# Patient Record
Sex: Male | Born: 1965
Health system: Southern US, Community
[De-identification: ages and names within clinical notes are randomized; demographics above are authoritative.]

---

## 1997-10-21 ENCOUNTER — Emergency Department (HOSPITAL_COMMUNITY): Admission: EM | Admit: 1997-10-21 | Discharge: 1997-10-21 | Payer: Self-pay | Admitting: Family Medicine

## 1998-05-07 ENCOUNTER — Ambulatory Visit (HOSPITAL_COMMUNITY): Admission: RE | Admit: 1998-05-07 | Discharge: 1998-05-07 | Payer: Self-pay | Admitting: Gastroenterology

## 2000-12-20 ENCOUNTER — Encounter: Admission: RE | Admit: 2000-12-20 | Discharge: 2000-12-20 | Payer: Self-pay | Admitting: Gastroenterology

## 2000-12-20 ENCOUNTER — Encounter: Payer: Self-pay | Admitting: Gastroenterology

## 2009-12-05 ENCOUNTER — Emergency Department (HOSPITAL_COMMUNITY): Admission: EM | Admit: 2009-12-05 | Discharge: 2009-12-05 | Payer: Self-pay | Admitting: Emergency Medicine

## 2010-05-26 LAB — D-DIMER, QUANTITATIVE: D-Dimer, Quant: <0.22 ug/mL-FEU (ref 0.00–0.48)

## 2010-05-26 LAB — BASIC METABOLIC PANEL
BUN: 17 mg/dL (ref 6–23)
CO2: 27 mEq/L (ref 19–32)
Calcium: 9.2 mg/dL (ref 8.4–10.5)
Chloride: 106 mEq/L (ref 96–112)
Creatinine, Ser: 0.96 mg/dL (ref 0.4–1.5)
GFR calc Af Amer: >60 mL/min (ref >60)
GFR calc non Af Amer: >60 mL/min (ref >60)
Glucose, Bld: 115 mg/dL — ABNORMAL HIGH (ref 70–99)
Potassium: 4.2 mEq/L (ref 3.5–5.1)
Sodium: 138 mEq/L (ref 135–145)

## 2010-05-26 LAB — DIFFERENTIAL
Basophils Absolute: 0 10*3/uL (ref 0.0–0.1)
Basophils Relative: 1 % (ref 0–1)
Eosinophils Absolute: 0.1 10*3/uL (ref 0.0–0.7)
Eosinophils Relative: 2 % (ref 0–5)
Lymphocytes Relative: 32 % (ref 12–46)
Lymphs Abs: 1.6 10*3/uL (ref 0.7–4.0)
Monocytes Absolute: 0.5 10*3/uL (ref 0.1–1.0)
Monocytes Relative: 11 % (ref 3–12)
Neutro Abs: 2.8 10*3/uL (ref 1.7–7.7)
Neutrophils Relative %: 55 % (ref 43–77)

## 2010-05-26 LAB — CBC
HCT: 42.6 % (ref 39.0–52.0)
Hemoglobin: 14.8 g/dL (ref 13.0–17.0)
MCH: 29.8 pg (ref 26.0–34.0)
MCHC: 34.7 g/dL (ref 30.0–36.0)
MCV: 85.9 fL (ref 78.0–100.0)
Platelets: 266 10*3/uL (ref 150–400)
RBC: 4.96 MIL/uL (ref 4.22–5.81)
RDW: 13 % (ref 11.5–15.5)
WBC: 5 10*3/uL (ref 4.0–10.5)

## 2017-11-03 DIAGNOSIS — A09 Infectious gastroenteritis and colitis, unspecified: Secondary | ICD-10-CM | POA: Diagnosis not present

## 2018-04-15 DIAGNOSIS — S92511A Displaced fracture of proximal phalanx of right lesser toe(s), initial encounter for closed fracture: Secondary | ICD-10-CM | POA: Diagnosis not present

## 2018-04-15 DIAGNOSIS — M79671 Pain in right foot: Secondary | ICD-10-CM | POA: Diagnosis not present

## 2018-05-06 DIAGNOSIS — S92511A Displaced fracture of proximal phalanx of right lesser toe(s), initial encounter for closed fracture: Secondary | ICD-10-CM | POA: Diagnosis not present

## 2019-01-27 DIAGNOSIS — M25521 Pain in right elbow: Secondary | ICD-10-CM | POA: Diagnosis not present

## 2019-02-20 DIAGNOSIS — M25521 Pain in right elbow: Secondary | ICD-10-CM | POA: Diagnosis not present

## 2019-03-18 DIAGNOSIS — Z125 Encounter for screening for malignant neoplasm of prostate: Secondary | ICD-10-CM | POA: Diagnosis not present

## 2019-03-18 DIAGNOSIS — Z13220 Encounter for screening for lipoid disorders: Secondary | ICD-10-CM | POA: Diagnosis not present

## 2019-03-18 DIAGNOSIS — Z0000 Encounter for general adult medical examination without abnormal findings: Secondary | ICD-10-CM | POA: Diagnosis not present

## 2019-05-05 ENCOUNTER — Ambulatory Visit: Payer: BC Managed Care – PPO | Admitting: Cardiovascular Disease

## 2019-05-05 ENCOUNTER — Other Ambulatory Visit: Payer: Self-pay

## 2019-05-05 ENCOUNTER — Encounter: Payer: Self-pay | Admitting: Cardiovascular Disease

## 2019-05-05 VITALS — BP 132/78 | HR 64 | Temp 96.8°F | Ht 68.0 in | Wt 177.2 lb

## 2019-05-05 DIAGNOSIS — E782 Mixed hyperlipidemia: Secondary | ICD-10-CM

## 2019-05-05 DIAGNOSIS — R079 Chest pain, unspecified: Secondary | ICD-10-CM

## 2019-05-05 MED ORDER — METOPROLOL TARTRATE 50 MG PO TABS: ORAL_TABLET | ORAL | 0 refills | Status: DC

## 2019-05-05 NOTE — Patient Instructions (Addendum)
Medication Instructions:  Take Metoprolol 2 hours before CT when scheduled.  *If you need a refill on your cardiac medications before your next appointment, please call your pharmacy*  Lab Work: BMET one week before CT when scheduled.  If you have labs (blood work) drawn today and your tests are completely normal, you will receive your results only by: Marland Kitchen MyChart Message (if you have MyChart) OR . A paper copy in the mail If you have any lab test that is abnormal or we need to change your treatment, we will call you to review the results.  Testing/Procedures: Your physician has requested that you have cardiac CT. Cardiac computed tomography (CT) is a painless test that uses an x-ray machine to take clear, detailed pictures of your heart. For further information please visit [email or web address]. Please follow instruction sheet as given.  Echocardiogram - Your physician has requested that you have an echocardiogram. Echocardiography is a painless test that uses sound waves to create images of your heart. It provides your doctor with information about the size and shape of your heart and how well your heart's chambers and valves are working. This procedure takes approximately one hour. There are no restrictions for this procedure. This will be performed at our [facility] location - [address], [address].    Follow-Up: At [facility], you and your health needs are our priority.  As part of our continuing mission to provide you with exceptional heart care, we have created designated Provider Care Teams.  These Care Teams include your primary Cardiologist (physician) and Advanced Practice Providers (APPs -  Physician Assistants and Nurse Practitioners) who all work together to provide you with the care you need, when you need it.  Your next appointment:   3 month(s)  The format for your next appointment:   In Person  Provider:   Eleonore Chiquito, MD  Other Instructions Your cardiac CT  will be scheduled at one of the below locations:   [facility] [address] [place], [place] [address] [phone]  If scheduled at [facility], please arrive at the [facility] main entrance of [facility] 30 minutes prior to test start time. Proceed to the [facility] Radiology Department (first floor) to check-in and test prep.  Please follow these instructions carefully (unless otherwise directed):  Hold all erectile dysfunction medications at least 3 days (72 hrs) prior to test.  On the Night Before the Test: . Be sure to Drink plenty of water. . Do not consume any caffeinated/decaffeinated beverages or chocolate 12 hours prior to your test. . Do not take any antihistamines 12 hours prior to your test. . If you take Metformin do not take 24 hours prior to test.  On the Day of the Test: . Drink plenty of water. Do not drink any water within one hour of the test. . Do not eat any food 4 hours prior to the test. . You may take your regular medications prior to the test.  . Take metoprolol (Lopressor) two hours prior to test. . HOLD Furosemide/Hydrochlorothiazide morning of the test. . FEMALES- please wear underwire-free bra if available       After the Test: . Drink plenty of water. . After receiving IV contrast, you may experience a mild flushed feeling. This is normal. . On occasion, you may experience a mild rash up to 24 hours after the test. This is not dangerous. If this occurs, you can take Benadryl 25  mg and increase your fluid intake. . If you experience trouble breathing, this can be serious. If it is severe call 911 IMMEDIATELY. If it is mild, please call our office. . If you take any of these medications: Glipizide/Metformin, Avandament, Glucavance, please do not take 48 hours after completing test unless otherwise instructed.   Once we have confirmed authorization from your insurance company, we will call you to set up a date and  time for your test.   For non-scheduling related questions, please contact the cardiac imaging nurse navigator should you have any questions/concerns: Rockwell Alexandria, RN Navigator Cardiac Imaging [facility] [phone] mobile
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
## 2019-05-19 ENCOUNTER — Ambulatory Visit (HOSPITAL_COMMUNITY): Payer: BC Managed Care – PPO | Attending: Cardiology

## 2019-05-19 ENCOUNTER — Other Ambulatory Visit: Payer: Self-pay

## 2019-05-19 DIAGNOSIS — R079 Chest pain, unspecified: Secondary | ICD-10-CM | POA: Diagnosis not present

## 2019-05-30 DIAGNOSIS — R079 Chest pain, unspecified: Secondary | ICD-10-CM | POA: Diagnosis not present

## 2019-05-31 LAB — BASIC METABOLIC PANEL
BUN/Creatinine Ratio: 19 (ref 9–20)
BUN: 18 mg/dL (ref 6–24)
CO2: 25 mmol/L (ref 20–29)
Calcium: 9 mg/dL (ref 8.7–10.2)
Chloride: 104 mmol/L (ref 96–106)
Creatinine, Ser: 0.96 mg/dL (ref 0.76–1.27)
GFR calc Af Amer: 104 mL/min/{1.73_m2} (ref >59)
GFR calc non Af Amer: 90 mL/min/{1.73_m2} (ref >59)
Glucose: 81 mg/dL (ref 65–99)
Potassium: 4.5 mmol/L (ref 3.5–5.2)
Sodium: 143 mmol/L (ref 134–144)

## 2019-06-05 ENCOUNTER — Ambulatory Visit (HOSPITAL_COMMUNITY)
Admission: RE | Admit: 2019-06-05 | Discharge: 2019-06-05 | Disposition: A | Discharge status: Home / Self Care | Payer: BC Managed Care – PPO | Source: Ambulatory Visit | Attending: Cardiovascular Disease | Admitting: Cardiovascular Disease

## 2019-06-05 ENCOUNTER — Other Ambulatory Visit: Payer: Self-pay

## 2019-06-05 ENCOUNTER — Encounter (HOSPITAL_COMMUNITY): Payer: Self-pay

## 2019-06-05 DIAGNOSIS — R079 Chest pain, unspecified: Secondary | ICD-10-CM | POA: Diagnosis not present

## 2019-06-05 IMAGING — CT CT HEART MORP W/ CTA COR W/ SCORE W/ CA W/CM &/OR W/O CM
1 of 2 series · 8 of 20 positions shown, 10 images · non-contrast
Comparison: None.
COMPARISON: None.

Addendum:
EXAM:
OVER-READ INTERPRETATION  CT CHEST

The following report is an over-read performed by radiologist Dr.
Pushki Makhachadze [REDACTED] on 06/05/2019. This over-read
does not include interpretation of cardiac or coronary anatomy or
pathology. The coronary CTA interpretation by the cardiologist is
attached.
CLINICAL DATA: Chest pain
Cardiac/Coronary CTA
TECHNIQUE: The patient was scanned on a Phillips Force scanner. A 100 kV
prospective scan was triggered in the descending thoracic aorta at
111 HU's. Axial non-contrast 3 mm slices were carried out through
the heart. The data set was analyzed on a dedicated work station and
scored using the Agatson method. Gantry rotation speed was 250 msecs
and collimation was .6 mm. No beta blockade and 0.8 mg of sl NTG was
given. The 3D data set was reconstructed in 5% intervals of the
35-75 % of the R-R cycle. Diastolic phases were analyzed on a
dedicated work station using MPR, MIP and VRT modes. The patient
received 80 cc of contrast.

[Series 341: findings · 8 of 12 slices shown, 10 images]
[im 2/12  vessel]
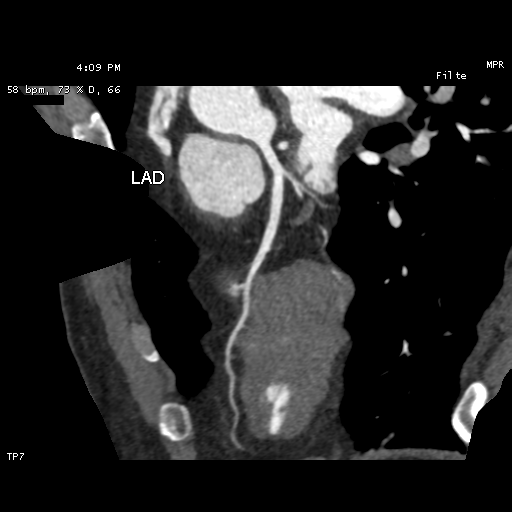
[im 2/12  lung]
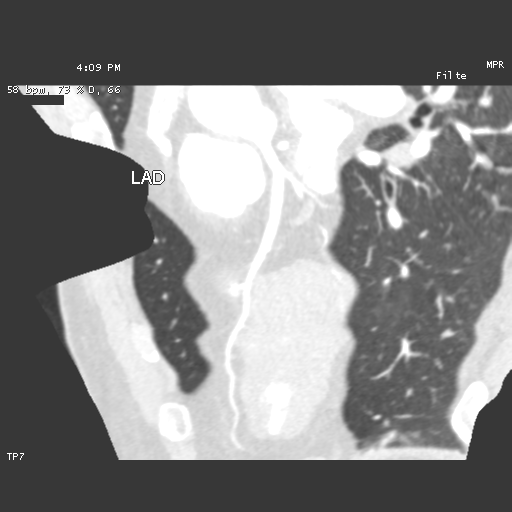
[im 3/12  vessel]
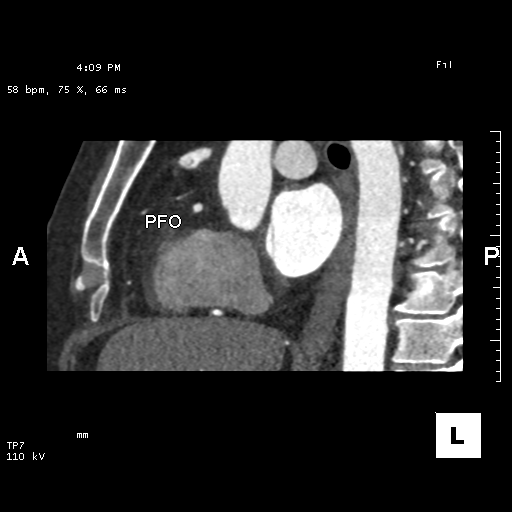
[im 4/12  vessel]
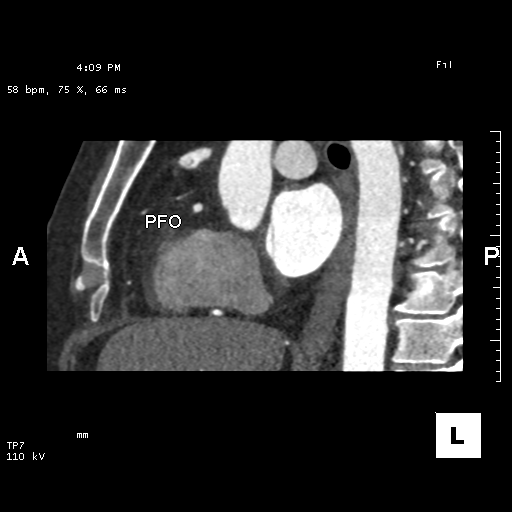
[im 5/12  vessel]
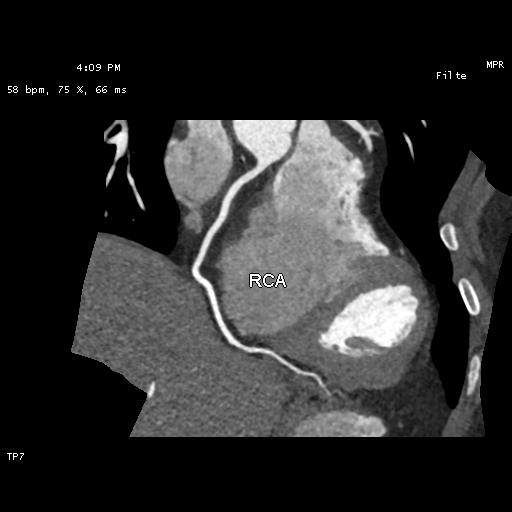
[im 7/12  vessel]
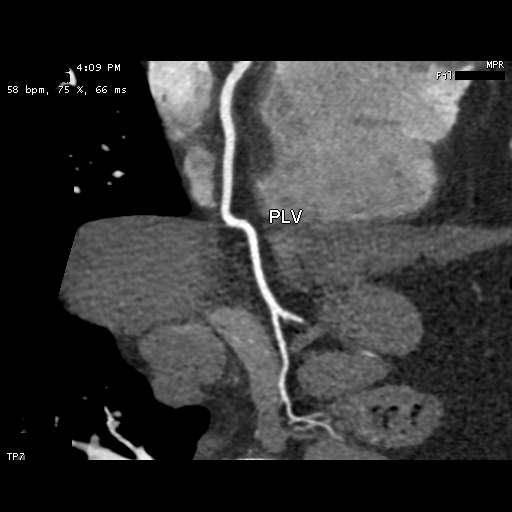
[im 7/12  lung]
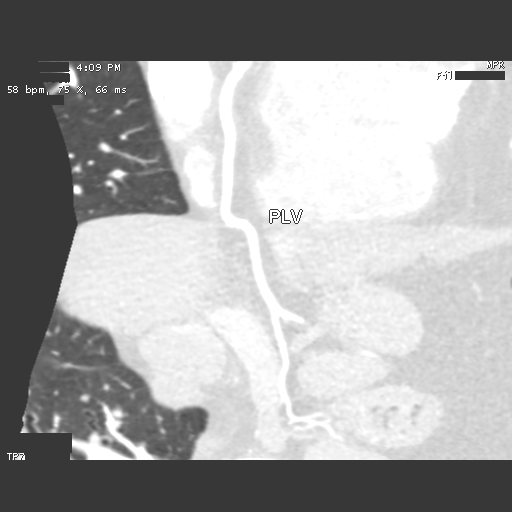
[im 8/12  vessel]
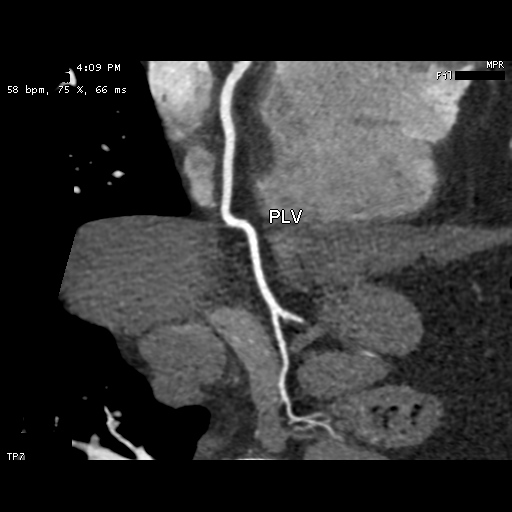
[im 9/12  vessel]
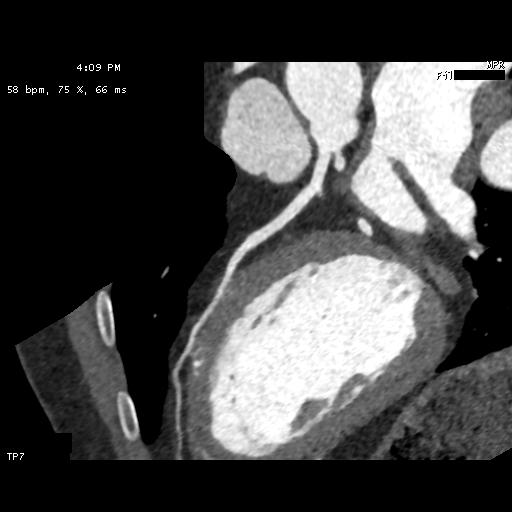
[im 10/12  vessel]
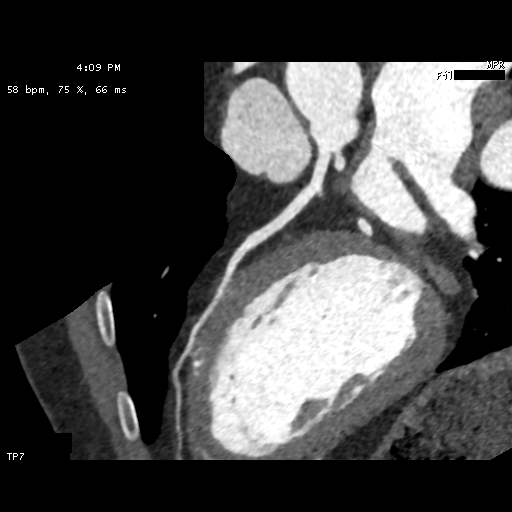

[8 of 20 positions shown; findings below may reference images not displayed]

FINDINGS: Vascular: Heart is normal size.  Visualized aorta normal caliber.

Mediastinum/Nodes: No adenopathy in the visualized lower mediastinum
or hila.

Lungs/Pleura: No confluent opacities or effusions.

Upper Abdomen: Imaging into the upper abdomen shows no acute
findings.

Musculoskeletal: Chest wall soft tissues are unremarkable. No acute
bony abnormality.
IMPRESSION: No acute or significant extracardiac abnormality.
FINDINGS: Image quality: excellent

Noise artifact is: Limited.

Coronary Arteries: Absent left main with separate ostia of LAD and
LCX. Right dominance.

Left main: Absent.  The LAD and LCX have separate ostia.

Left anterior descending artery: The LAD is patent without evidence
of plaque or stenosis. A mid LAD myocardial bridge is present. the
LAD gives off 2 patent diagonal branches.

Left circumflex artery: The LCX is non-dominant and patent with no
evidence of plaque or stenosis. The LCX gives off 1 patent obtuse
marginal branch.

Right coronary artery: The RCA is dominant with normal take off from
the right coronary cusp. There is no evidence of plaque or stenosis.
The RCA terminates as a PDA and right posterolateral branch without
evidence of plaque or stenosis.

Right Atrium: Right atrial size is within normal limits.

Right Ventricle: The right ventricular cavity is within normal
limits.

Left Atrium: Left atrial size is normal in size with no left atrial
appendage filling defect. A small PFO is present.

Left Ventricle: The ventricular cavity size is within normal limits.
There are no stigmata of prior infarction. There is no abnormal
filling defect.

Pulmonary arteries: Normal in size without proximal filling defect.

Pulmonary veins: Normal pulmonary venous drainage.

Pericardium: Normal thickness with no significant effusion or
calcium present.

Cardiac valves: The aortic valve is trileaflet without significant
calcification. The mitral valve is normal structure without
significant calcification.

Aorta: Normal caliber with no significant disease.

Extra-cardiac findings: See attached radiology report for
non-cardiac structures.
IMPRESSION: 1. Coronary calcium score of 0.

2. Normal coronary origin with right dominance.

3. Absent left main with separate ostia of LAD and LCX (benign
anomaly).

4. No evidence of CAD.

5. Small PFO.

6. Mid LAD myocardial bridge (normal variant).

RECOMMENDATIONS:
1. No evidence of CAD (0%). Consider non-atherosclerotic causes of
chest pain.

*** End of Addendum ***
EXAM:
OVER-READ INTERPRETATION  CT CHEST

The following report is an over-read performed by radiologist Dr.
Pushki Makhachadze [REDACTED] on 06/05/2019. This over-read
does not include interpretation of cardiac or coronary anatomy or
pathology. The coronary CTA interpretation by the cardiologist is
attached.
FINDINGS: Vascular: Heart is normal size.  Visualized aorta normal caliber.

Mediastinum/Nodes: No adenopathy in the visualized lower mediastinum
or hila.

Lungs/Pleura: No confluent opacities or effusions.

Upper Abdomen: Imaging into the upper abdomen shows no acute
findings.

Musculoskeletal: Chest wall soft tissues are unremarkable. No acute
bony abnormality.
IMPRESSION: No acute or significant extracardiac abnormality.

## 2019-06-05 MED ORDER — IOHEXOL 350 MG/ML SOLN
80.0000 mL | Freq: Once | INTRAVENOUS | Status: AC | PRN
  Administered 2019-06-05: 80 mL via INTRAVENOUS

## 2019-06-05 MED ORDER — NITROGLYCERIN 0.4 MG SL SUBL
0.8000 mg | SUBLINGUAL_TABLET | Freq: Once | SUBLINGUAL | Status: AC
  Administered 2019-06-05: 0.8 mg via SUBLINGUAL

## 2019-06-05 MED ORDER — NITROGLYCERIN 0.4 MG SL SUBL
SUBLINGUAL_TABLET | SUBLINGUAL | Status: AC
  Filled 2019-06-05: qty 2
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
## 2019-08-05 ENCOUNTER — Other Ambulatory Visit: Payer: Self-pay

## 2019-08-05 ENCOUNTER — Ambulatory Visit: Payer: BC Managed Care – PPO | Admitting: Cardiovascular Disease

## 2019-08-05 ENCOUNTER — Encounter: Payer: Self-pay | Admitting: Cardiovascular Disease

## 2019-08-05 VITALS — BP 122/80 | HR 61 | Ht 68.0 in | Wt 181.6 lb

## 2019-08-05 DIAGNOSIS — R079 Chest pain, unspecified: Secondary | ICD-10-CM

## 2019-08-05 NOTE — Patient Instructions (Signed)
Medication Instructions:  The current medical regimen is effective;  continue present plan and medications.  *If you need a refill on your cardiac medications before your next appointment, please call your pharmacy*    Follow-Up: At [facility], you and your health needs are our priority.  As part of our continuing mission to provide you with exceptional heart care, we have created designated Provider Care Teams.  These Care Teams include your primary Cardiologist (physician) and Advanced Practice Providers (APPs -  Physician Assistants and Nurse Practitioners) who all work together to provide you with the care you need, when you need it.  We recommend signing up for the patient portal called "MyChart".  Sign up information is provided on this After Visit Summary.  MyChart is used to connect with patients for Virtual Visits (Telemedicine).  Patients are able to view lab/test results, encounter notes, upcoming appointments, etc.  Non-urgent messages can be sent to your provider as well.   To learn more about what you can do with MyChart, go to [email or web address].    Your next appointment:   As needed  The format for your next appointment:   In Person  Provider:   Mildred O'Neal, MD      

## 2020-02-14 DIAGNOSIS — J40 Bronchitis, not specified as acute or chronic: Secondary | ICD-10-CM | POA: Diagnosis not present

## 2020-02-14 DIAGNOSIS — J329 Chronic sinusitis, unspecified: Secondary | ICD-10-CM | POA: Diagnosis not present

## 2020-02-14 DIAGNOSIS — J029 Acute pharyngitis, unspecified: Secondary | ICD-10-CM | POA: Diagnosis not present

## 2020-02-14 DIAGNOSIS — H6983 Other specified disorders of Eustachian tube, bilateral: Secondary | ICD-10-CM | POA: Diagnosis not present

## 2020-02-21 DIAGNOSIS — Z20822 Contact with and (suspected) exposure to covid-19: Secondary | ICD-10-CM | POA: Diagnosis not present

## 2020-02-26 DIAGNOSIS — Z03818 Encounter for observation for suspected exposure to other biological agents ruled out: Secondary | ICD-10-CM | POA: Diagnosis not present

## 2020-02-26 DIAGNOSIS — Z20822 Contact with and (suspected) exposure to covid-19: Secondary | ICD-10-CM | POA: Diagnosis not present

## 2020-03-03 DIAGNOSIS — Z03818 Encounter for observation for suspected exposure to other biological agents ruled out: Secondary | ICD-10-CM | POA: Diagnosis not present

## 2020-03-03 DIAGNOSIS — Z20822 Contact with and (suspected) exposure to covid-19: Secondary | ICD-10-CM | POA: Diagnosis not present

## 2020-03-22 DIAGNOSIS — Z131 Encounter for screening for diabetes mellitus: Secondary | ICD-10-CM | POA: Diagnosis not present

## 2020-03-22 DIAGNOSIS — Z8719 Personal history of other diseases of the digestive system: Secondary | ICD-10-CM | POA: Diagnosis not present

## 2020-03-22 DIAGNOSIS — Z52008 Unspecified donor, other blood: Secondary | ICD-10-CM | POA: Diagnosis not present

## 2020-03-22 DIAGNOSIS — Z125 Encounter for screening for malignant neoplasm of prostate: Secondary | ICD-10-CM | POA: Diagnosis not present

## 2020-03-22 DIAGNOSIS — Z13220 Encounter for screening for lipoid disorders: Secondary | ICD-10-CM | POA: Diagnosis not present

## 2020-03-26 DIAGNOSIS — Z0000 Encounter for general adult medical examination without abnormal findings: Secondary | ICD-10-CM | POA: Diagnosis not present

## 2020-04-09 DIAGNOSIS — Z03818 Encounter for observation for suspected exposure to other biological agents ruled out: Secondary | ICD-10-CM | POA: Diagnosis not present

## 2020-04-09 DIAGNOSIS — Z20822 Contact with and (suspected) exposure to covid-19: Secondary | ICD-10-CM | POA: Diagnosis not present

## 2020-05-03 DIAGNOSIS — Z20822 Contact with and (suspected) exposure to covid-19: Secondary | ICD-10-CM | POA: Diagnosis not present

## 2020-05-03 DIAGNOSIS — Z03818 Encounter for observation for suspected exposure to other biological agents ruled out: Secondary | ICD-10-CM | POA: Diagnosis not present

## 2020-08-23 DIAGNOSIS — M7502 Adhesive capsulitis of left shoulder: Secondary | ICD-10-CM | POA: Diagnosis not present

## 2020-08-31 DIAGNOSIS — M25512 Pain in left shoulder: Secondary | ICD-10-CM | POA: Diagnosis not present

## 2020-09-02 DIAGNOSIS — M25512 Pain in left shoulder: Secondary | ICD-10-CM | POA: Diagnosis not present

## 2020-09-14 DIAGNOSIS — M25512 Pain in left shoulder: Secondary | ICD-10-CM | POA: Diagnosis not present

## 2020-09-16 DIAGNOSIS — M25512 Pain in left shoulder: Secondary | ICD-10-CM | POA: Diagnosis not present

## 2020-09-21 DIAGNOSIS — M25512 Pain in left shoulder: Secondary | ICD-10-CM | POA: Diagnosis not present

## 2020-09-30 DIAGNOSIS — M25512 Pain in left shoulder: Secondary | ICD-10-CM | POA: Diagnosis not present

## 2020-10-01 DIAGNOSIS — E7800 Pure hypercholesterolemia, unspecified: Secondary | ICD-10-CM | POA: Diagnosis not present

## 2020-10-07 DIAGNOSIS — M25512 Pain in left shoulder: Secondary | ICD-10-CM | POA: Diagnosis not present

## 2020-10-21 DIAGNOSIS — R1013 Epigastric pain: Secondary | ICD-10-CM | POA: Diagnosis not present

## 2020-10-21 DIAGNOSIS — R12 Heartburn: Secondary | ICD-10-CM | POA: Diagnosis not present

## 2020-10-22 DIAGNOSIS — R1013 Epigastric pain: Secondary | ICD-10-CM | POA: Diagnosis not present

## 2020-10-25 ENCOUNTER — Other Ambulatory Visit: Payer: Self-pay | Admitting: Family Medicine

## 2020-10-25 DIAGNOSIS — R1013 Epigastric pain: Secondary | ICD-10-CM

## 2020-10-25 DIAGNOSIS — R109 Unspecified abdominal pain: Secondary | ICD-10-CM

## 2020-10-26 ENCOUNTER — Ambulatory Visit
Admission: RE | Admit: 2020-10-26 | Discharge: 2020-10-26 | Disposition: A | Discharge status: Home / Self Care | Payer: BC Managed Care – PPO | Source: Ambulatory Visit | Attending: Family Medicine | Admitting: Family Medicine

## 2020-10-26 ENCOUNTER — Other Ambulatory Visit: Payer: Self-pay

## 2020-10-26 DIAGNOSIS — R1013 Epigastric pain: Secondary | ICD-10-CM

## 2020-10-26 DIAGNOSIS — M5137 Other intervertebral disc degeneration, lumbosacral region: Secondary | ICD-10-CM | POA: Diagnosis not present

## 2020-10-26 DIAGNOSIS — R109 Unspecified abdominal pain: Secondary | ICD-10-CM

## 2020-10-26 DIAGNOSIS — M4317 Spondylolisthesis, lumbosacral region: Secondary | ICD-10-CM | POA: Diagnosis not present

## 2020-10-26 DIAGNOSIS — K429 Umbilical hernia without obstruction or gangrene: Secondary | ICD-10-CM | POA: Diagnosis not present

## 2020-10-26 DIAGNOSIS — K449 Diaphragmatic hernia without obstruction or gangrene: Secondary | ICD-10-CM | POA: Diagnosis not present

## 2020-10-26 MED ORDER — IOPAMIDOL (ISOVUE-300) INJECTION 61%
100.0000 mL | Freq: Once | INTRAVENOUS | Status: AC | PRN
  Administered 2020-10-26: 100 mL via INTRAVENOUS

## 2020-11-12 DIAGNOSIS — R1084 Generalized abdominal pain: Secondary | ICD-10-CM | POA: Diagnosis not present

## 2021-02-11 DIAGNOSIS — Z1211 Encounter for screening for malignant neoplasm of colon: Secondary | ICD-10-CM | POA: Diagnosis not present

## 2021-02-11 DIAGNOSIS — K6389 Other specified diseases of intestine: Secondary | ICD-10-CM | POA: Diagnosis not present

## 2021-02-11 DIAGNOSIS — K5730 Diverticulosis of large intestine without perforation or abscess without bleeding: Secondary | ICD-10-CM | POA: Diagnosis not present

## 2021-02-11 DIAGNOSIS — Z86010 Personal history of colonic polyps: Secondary | ICD-10-CM | POA: Diagnosis not present

## 2021-03-03 DIAGNOSIS — R051 Acute cough: Secondary | ICD-10-CM | POA: Diagnosis not present

## 2021-03-03 DIAGNOSIS — J069 Acute upper respiratory infection, unspecified: Secondary | ICD-10-CM | POA: Diagnosis not present

## 2021-03-03 DIAGNOSIS — J209 Acute bronchitis, unspecified: Secondary | ICD-10-CM | POA: Diagnosis not present

## 2021-03-03 DIAGNOSIS — J029 Acute pharyngitis, unspecified: Secondary | ICD-10-CM | POA: Diagnosis not present

## 2021-03-03 DIAGNOSIS — Z03818 Encounter for observation for suspected exposure to other biological agents ruled out: Secondary | ICD-10-CM | POA: Diagnosis not present

## 2021-04-01 DIAGNOSIS — Z0000 Encounter for general adult medical examination without abnormal findings: Secondary | ICD-10-CM | POA: Diagnosis not present

## 2021-04-01 DIAGNOSIS — F524 Premature ejaculation: Secondary | ICD-10-CM | POA: Diagnosis not present

## 2021-04-01 DIAGNOSIS — Z13220 Encounter for screening for lipoid disorders: Secondary | ICD-10-CM | POA: Diagnosis not present

## 2021-04-01 DIAGNOSIS — Z125 Encounter for screening for malignant neoplasm of prostate: Secondary | ICD-10-CM | POA: Diagnosis not present

## 2021-04-01 DIAGNOSIS — Z79899 Other long term (current) drug therapy: Secondary | ICD-10-CM | POA: Diagnosis not present

## 2021-04-01 DIAGNOSIS — Z131 Encounter for screening for diabetes mellitus: Secondary | ICD-10-CM | POA: Diagnosis not present

## 2021-04-23 DIAGNOSIS — J019 Acute sinusitis, unspecified: Secondary | ICD-10-CM | POA: Diagnosis not present

## 2021-04-23 DIAGNOSIS — J40 Bronchitis, not specified as acute or chronic: Secondary | ICD-10-CM | POA: Diagnosis not present

## 2021-07-08 DIAGNOSIS — E7849 Other hyperlipidemia: Secondary | ICD-10-CM | POA: Diagnosis not present

## 2021-07-08 DIAGNOSIS — E782 Mixed hyperlipidemia: Secondary | ICD-10-CM | POA: Diagnosis not present

## 2021-07-08 DIAGNOSIS — H9312 Tinnitus, left ear: Secondary | ICD-10-CM | POA: Diagnosis not present

## 2021-08-07 DIAGNOSIS — J208 Acute bronchitis due to other specified organisms: Secondary | ICD-10-CM | POA: Diagnosis not present

## 2021-10-21 DIAGNOSIS — H00011 Hordeolum externum right upper eyelid: Secondary | ICD-10-CM | POA: Diagnosis not present

## 2022-04-07 DIAGNOSIS — M549 Dorsalgia, unspecified: Secondary | ICD-10-CM | POA: Diagnosis not present

## 2022-04-07 DIAGNOSIS — R0981 Nasal congestion: Secondary | ICD-10-CM | POA: Diagnosis not present

## 2022-04-07 DIAGNOSIS — Z125 Encounter for screening for malignant neoplasm of prostate: Secondary | ICD-10-CM | POA: Diagnosis not present

## 2022-04-07 DIAGNOSIS — E7800 Pure hypercholesterolemia, unspecified: Secondary | ICD-10-CM | POA: Diagnosis not present

## 2022-04-07 DIAGNOSIS — Z0000 Encounter for general adult medical examination without abnormal findings: Secondary | ICD-10-CM | POA: Diagnosis not present

## 2022-10-05 DIAGNOSIS — W57XXXA Bitten or stung by nonvenomous insect and other nonvenomous arthropods, initial encounter: Secondary | ICD-10-CM | POA: Diagnosis not present

## 2022-10-05 DIAGNOSIS — J0110 Acute frontal sinusitis, unspecified: Secondary | ICD-10-CM | POA: Diagnosis not present

## 2022-10-05 DIAGNOSIS — S30861A Insect bite (nonvenomous) of abdominal wall, initial encounter: Secondary | ICD-10-CM | POA: Diagnosis not present

## 2023-04-06 DIAGNOSIS — Z125 Encounter for screening for malignant neoplasm of prostate: Secondary | ICD-10-CM | POA: Diagnosis not present

## 2023-04-06 DIAGNOSIS — E785 Hyperlipidemia, unspecified: Secondary | ICD-10-CM | POA: Diagnosis not present

## 2023-04-12 DIAGNOSIS — Z23 Encounter for immunization: Secondary | ICD-10-CM | POA: Diagnosis not present

## 2023-04-12 DIAGNOSIS — Z0000 Encounter for general adult medical examination without abnormal findings: Secondary | ICD-10-CM | POA: Diagnosis not present

## 2023-11-02 ENCOUNTER — Ambulatory Visit (HOSPITAL_COMMUNITY)
Admission: RE | Admit: 2023-11-02 | Discharge: 2023-11-02 | Disposition: A | Discharge status: Home / Self Care | Payer: Self-pay | Source: Ambulatory Visit | Attending: Cardiology | Admitting: Cardiology

## 2023-11-02 ENCOUNTER — Encounter: Payer: Self-pay | Admitting: Cardiology

## 2023-11-02 ENCOUNTER — Other Ambulatory Visit (HOSPITAL_COMMUNITY): Payer: Self-pay

## 2023-11-02 ENCOUNTER — Ambulatory Visit: Attending: Cardiology | Admitting: Cardiology

## 2023-11-02 VITALS — BP 118/86 | HR 62 | Ht 67.0 in | Wt 189.0 lb

## 2023-11-02 DIAGNOSIS — E782 Mixed hyperlipidemia: Secondary | ICD-10-CM | POA: Diagnosis not present

## 2023-11-02 DIAGNOSIS — R000 Tachycardia, unspecified: Secondary | ICD-10-CM | POA: Diagnosis not present

## 2023-11-02 DIAGNOSIS — R0683 Snoring: Secondary | ICD-10-CM | POA: Insufficient documentation

## 2023-11-02 MED ORDER — DILTIAZEM HCL 30 MG PO TABS
30.0000 mg | ORAL_TABLET | Freq: Three times a day (TID) | ORAL | 3 refills | Status: AC | PRN
  Filled 2023-11-02: qty 30, 10d supply, fill #0

## 2023-11-02 NOTE — Progress Notes (Signed)
 Cardiology Office Note:  .   Date:  11/02/2023  ID:  Jason Howe, DOB [birth year], MRN [id number] PCP: Cyrena Gwenn SQUIBB, MD (Inactive)  [facility] Providers Cardiologist:  Newman Lawrence, MD PCP: Cyrena Gwenn SQUIBB, MD (Inactive)  Chief Complaint  Patient presents with   Tachycardia     Jason Howe is a 58 y.o. male with hyperlipidemia, history of early CAD, episodes of tachycardia  Discussed the use of AI scribe software for clinical note transcription with the patient, who gave verbal consent to proceed.  History of Present Illness Jason Howe is a 58 year old male who presents with episodes of rapid heart rate. He is accompanied by his wife.  He experienced an episode of rapid heart rate one week ago while on a flight, with a heart rate reaching 130 bpm. He felt lightheaded and dizzy when standing. The episode lasted about four hours without chest pain or shortness of breath, but he felt tired and exhausted. Since then, he has not had similar symptoms. His heart rate increases with stress but not with physical activity.  He occasionally feels lightheaded when standing quickly and notices slight ankle swelling. A CT scan in 2021 showed no arterial calcium, and an echocardiogram was normal.  His family history includes significant cardiovascular issues: his father had multiple strokes and a quadruple bypass, his mother had hypertension, and his paternal grandfather had heart attacks.  He does not smoke and consumes alcohol variably, up to four drinks a week. He has a sedentary job but walks his dog regularly. He has high cholesterol, which he associates with his weight, though lipoprotein levels were normal earlier this year.      Vitals:   11/02/23 0836  BP: 118/86  Pulse: 62  SpO2: 99%      Review of Systems  Cardiovascular:  Positive for palpitations. Negative for chest pain, dyspnea on exertion, leg swelling and syncope.  Respiratory:   Positive for snoring.         Studies Reviewed: SABRA    EKG 11/02/2023: Normal sinus rhythm with sinus arrhythmia Normal ECG No previous ECGs available     Labs 03/2023: Chol 248, TG 100, HDL 49, LDL 181 HbA1C 5.5% Hb 14.6 Cr 0.9  Risk Assessment/Calculations:    CHA2DS2-VASc Score = 0  This indicates a 0.2% annual risk of stroke. The patient's score is based upon: CHF History: 0 HTN History: 0 Diabetes History: 0 Stroke History: 0 Vascular Disease History: 0 Age Score: 0 Gender Score: 0      Physical Exam Vitals and nursing note reviewed.  Constitutional:      General: He is not in acute distress. Neck:     Vascular: No JVD.  Cardiovascular:     Rate and Rhythm: Normal rate and regular rhythm.     Heart sounds: Normal heart sounds. No murmur heard. Pulmonary:     Effort: Pulmonary effort is normal.     Breath sounds: Normal breath sounds. No wheezing or rales.  Musculoskeletal:     Right lower leg: No edema.     Left lower leg: No edema.      VISIT DIAGNOSES:   ICD-10-CM   1. Tachycardia  R00.0 EKG 12-Lead    ECHOCARDIOGRAM COMPLETE    diltiazem  (CARDIZEM ) 30 MG tablet    2. Mixed hyperlipidemia  E78.2 CT CARDIAC SCORING (SELF PAY ONLY)    3. Snoring  R06.83 [facility] Sleep Study       Jason D  Jason Howe is a 58 y.o. male with hyperlipidemia, history of early CAD, episodes of tachycardia Assessment & Plan Suspected paroxysmal supraventricular tachycardia or atrial fibrillation Episodes of rapid heart rate suggest atrial fibrillation or paroxysmal supraventricular tachycardia. EKG normal. Differential includes atrial fibrillation and supraventricular tachycardia. Discussed potential correlation with sleep apnea. - Given the unpredictable nature of its frequency, patient would prefer to use cardia mobile and is recently got, instead of placing him on a 30-day monitor.  I think this is perfectly reasonable.  Patient knows to notify us  should any severe  episodes of tachycardia noted on cardia mobile. Will obtain echocardiogram to assess baseline structure and function.  Echocardiogram was normal in 2021. Fortunately, his CHA2DS2-VASc score is 0 and anticoagulation may not be necessary unless you are considering rhythm control therapy. Also discussed vagal maneuvers send prescribed diltiazem  30 mg 3 times daily as needed, in case these episodes are supraventricular tachycardia.  Suspected obstructive sleep apnea: Significant snoring reported, suspect sleep apnea, especially given ongoing workup for possible A-fib or supraventricular tachycardia. Will arrange home sleep study.  Hypercholesterolemia, likely familial: Cholesterol levels high, possibly familial. Previous calcium score zero, but current risk factors warrant reassessment. Managing cholesterol important to reduce risk of coronary artery disease, heart attack, and stroke. Discussed alternative options if calcium score indicates significant risk. - Order calcium score scan to assess coronary artery calcium. - Discuss potential use of statins or alternative therapies based on calcium score results.  Patient would like to avoid statin if/possible. - Advise heart-healthy diet and lifestyle changes.       Meds ordered this encounter  Medications   diltiazem  (CARDIZEM ) 30 MG tablet    Sig: Take 1 tablet (30 mg total) by mouth 3 (three) times daily as needed.    Dispense:  30 tablet    Refill:  3     F/u in 3 months  Signed, Newman JINNY Lawrence, MD

## 2023-11-02 NOTE — Telephone Encounter (Signed)
 Are you okay with the switch?

## 2023-11-02 NOTE — Telephone Encounter (Signed)
 Whatever patient would prefer.  Thanks MJP

## 2023-11-02 NOTE — Patient Instructions (Signed)
 Medication Instructions:  START Diltiazem  30 mg three times a day AS NEEDED   *If you need a refill on your cardiac medications before your next appointment, please call your pharmacy*   Testing/Procedures: Calcium score   CT scanning for a cardiac calcium score (CAT scanning), is a noninvasive, special x-ray that produces cross-sectional images of the body using x-rays and a computer. CT scans help physicians diagnose and treat medical conditions. For some CT exams, a contrast material is used to enhance visibility in the area of the body being studied. CT scans provide greater clarity and reveal more details than regular x-ray exams. This is not covered by insurance and will be an out-of-pocket cost of approximately $99.   Echocardiogram  Your physician has requested that you have an echocardiogram. Echocardiography is a painless test that uses sound waves to create images of your heart. It provides your doctor with information about the size and shape of your heart and how well your heart's chambers and valves are working. This procedure takes approximately one hour. There are no restrictions for this procedure. Please do NOT wear cologne, perfume, aftershave, or lotions (deodorant is allowed). Please arrive 15 minutes prior to your appointment time.  Please note: We ask at that you not bring children with you during ultrasound (echo/ vascular) testing. Due to room size and safety concerns, children are not allowed in the ultrasound rooms during exams. Our front office staff cannot provide observation of children in our lobby area while testing is being conducted. An adult accompanying a patient to their appointment will only be allowed in the ultrasound room at the discretion of the ultrasound technician under special circumstances. We apologize for any inconvenience.  [facility] SLEEP STUDY  Your physician has recommended that you have a sleep study. This test records several body functions  during sleep, including: brain activity, eye movement, oxygen and carbon dioxide blood levels, heart rate and rhythm, breathing rate and rhythm, the flow of air through your mouth and nose, snoring, body muscle movements, and chest and belly movement.  Follow-Up: At [facility], you and your health needs are our priority.  As part of our continuing mission to provide you with exceptional heart care, our providers are all part of one team.  This team includes your primary Cardiologist (physician) and Advanced Practice Providers or APPs (Physician Assistants and Nurse Practitioners) who all work together to provide you with the care you need, when you need it.  Your next appointment:   3 month(s)  Provider:   Newman JINNY Lawrence, MD

## 2023-11-08 ENCOUNTER — Ambulatory Visit: Payer: Self-pay | Admitting: Cardiology

## 2023-11-09 ENCOUNTER — Telehealth: Payer: Self-pay | Admitting: Cardiology

## 2023-11-09 NOTE — Telephone Encounter (Signed)
 Pt would like to do a provider switch TO: [facility]  From [facility]

## 2023-11-09 NOTE — Progress Notes (Signed)
 Yes, defer to Dr. Barbaraann Please note that appointment is still showing up on my schedule for 11/20, but do not see anything scheduled for Dr. Barbaraann.  May need to fix that.  Thanks MJP

## 2023-11-27 MED ORDER — ATORVASTATIN CALCIUM 40 MG PO TABS
40.0000 mg | ORAL_TABLET | Freq: Every day | ORAL | 3 refills | Status: DC
Start: 2023-11-27 — End: 2024-01-15

## 2023-12-03 ENCOUNTER — Ambulatory Visit (HOSPITAL_COMMUNITY)
Admission: RE | Admit: 2023-12-03 | Discharge: 2023-12-03 | Disposition: A | Discharge status: Home / Self Care | Source: Ambulatory Visit | Attending: Cardiology | Admitting: Cardiology

## 2023-12-03 DIAGNOSIS — I350 Nonrheumatic aortic (valve) stenosis: Secondary | ICD-10-CM

## 2023-12-03 DIAGNOSIS — R000 Tachycardia, unspecified: Secondary | ICD-10-CM | POA: Insufficient documentation

## 2023-12-03 LAB — ECHOCARDIOGRAM COMPLETE
Area-P 1/2: 4.26 cm2
S' Lateral: 2.91 cm

## 2023-12-04 NOTE — Telephone Encounter (Signed)
 Spoke with pt regarding an office visit. Pt is scheduled to see Dr. Barbaraann on 11/10 at 10:40am. Pt would like to be placed on waitlist for sooner appointment if there is a cancellation.

## 2024-01-15 ENCOUNTER — Encounter: Payer: Self-pay | Admitting: Cardiovascular Disease

## 2024-01-15 ENCOUNTER — Ambulatory Visit: Attending: Cardiovascular Disease | Admitting: Cardiovascular Disease

## 2024-01-15 VITALS — BP 124/74 | HR 54 | Ht 67.0 in | Wt 190.0 lb

## 2024-01-15 DIAGNOSIS — R000 Tachycardia, unspecified: Secondary | ICD-10-CM | POA: Diagnosis not present

## 2024-01-15 DIAGNOSIS — E782 Mixed hyperlipidemia: Secondary | ICD-10-CM | POA: Diagnosis not present

## 2024-01-15 DIAGNOSIS — R931 Abnormal findings on diagnostic imaging of heart and coronary circulation: Secondary | ICD-10-CM

## 2024-01-15 NOTE — Progress Notes (Signed)
 Cardiology Office Note:  .   Date:  01/15/2024  ID:  Jason Howe, DOB 19-Aug-1965, MRN [id number] PCP: Dayna Motto, DO  [facility] Providers Cardiologist:  Newman JINNY Lawrence, MD { History of Present Illness: .    Chief Complaint  Patient presents with   Follow-up         Jason Howe is a 59 y.o. male with history of HLD who presents for follow-up.   History of Present Illness   Jason Howe is a 58 year old male with tachycardia who presents for follow-up.  He experienced an episode of tachycardia while traveling on an airplane from [place] . His Apple Watch alerted him to a heart rate of 130 bpm while he was seated and asymptomatic. Approximately an hour later, the watch alerted him again to the same heart rate. Upon standing to go to the bathroom, he experienced dizziness. After returning to his seat, his heart rate initially dropped to 65-70 bpm but then returned to 130 bpm. No chest pain or shortness of breath occurred during this episode. He has not experienced any further episodes of tachycardia since the incident on the plane.  He has been using his Apple Watch to monitor his heart rhythm and has since purchased a KardiaMobile device for additional monitoring. He was previously prescribed diltiazem  to take as needed for tachycardia but has not used it yet. He carries the medication with him along with his KardiaMobile device.  He has a history of hyperlipidemia with a total cholesterol of 248 mg/dL, HDL of 49 mg/dL, LDL of 818 mg/dL, and triglycerides of 899 mg/dL. He has a coronary calcium  score of 1. He is not currently taking a statin and prefers to manage his cholesterol through diet and exercise. He notes that his cholesterol levels fluctuate with his weight and mentions a recent weight gain.  He reports a family history of heart disease, with his father having multiple strokes. He travels frequently for work, works as a public librarian, and  walks 1-2 miles daily when at home. No chest pain, shortness of breath, or further episodes of tachycardia since the initial incident.          Problem List HLD -T chol 248, HDL 49, LDL 181, TG 100 Tachycardia  CAC=1 (37th percentile)    ROS: All other ROS reviewed and negative. Pertinent positives noted in the HPI.     Studies Reviewed: SABRA       TTE 12/03/2023  1. Left ventricular ejection fraction, by estimation, is 60 to 65%. Left  ventricular ejection fraction by 3D volume is 60 %. The left ventricle has  normal function. The left ventricle has no regional wall motion  abnormalities. Left ventricular diastolic   parameters were normal. The average left ventricular global longitudinal  strain is -21.4 %. The global longitudinal strain is normal.   2. Right ventricular systolic function is normal. The right ventricular  size is normal.   3. The mitral valve is normal in structure. No evidence of mitral valve  regurgitation. No evidence of mitral stenosis.   4. The aortic valve is tricuspid. Aortic valve regurgitation is not  visualized. Aortic valve sclerosis is present, with no evidence of aortic  valve stenosis.   5. The inferior vena cava is normal in size with greater than 50%  respiratory variability, suggesting right atrial pressure of 3 mmHg.   CCTA 06/05/2019 IMPRESSION: 1. Coronary calcium  score of 0.   2. Normal coronary origin with  right dominance.   3. 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   4. No evidence of CAD.   5. Small PFO.   6. Mid LAD myocardial bridge (normal variant). Physical Exam:   VS:  BP 124/74 (BP Location: Left Arm, Patient Position: Sitting, Cuff Size: Large)   Pulse (!) 54   Ht 5' 7 (1.702 m)   Wt 190 lb (86.2 kg)   SpO2 99%   BMI 29.76 kg/m    Wt Readings from Last 3 Encounters:  01/15/24 190 lb (86.2 kg)  11/02/23 189 lb (85.7 kg)  08/05/19 181 lb 9.6 oz (82.4 kg)    GEN: Well nourished, well  developed in no acute distress NECK: No JVD; No carotid bruits CARDIAC: RRR, no murmurs, rubs, gallops RESPIRATORY:  Clear to auscultation without rales, wheezing or rhonchi  ABDOMEN: Soft, non-tender, non-distended EXTREMITIES:  No edema; No deformity  ASSESSMENT AND PLAN: .   Assessment and Plan    Paroxysmal supraventricular tachycardia Isolated episode of tachycardia at 130 bpm with dizziness. No further episodes or arrhythmias on KardiaMobile. Low coronary artery disease risk. Episode considered isolated. - Monitor heart rhythm with KardiaMobile. - Use diltiazem  as needed for tachycardia. - Follow up in one year unless symptoms recur.  Hyperlipidemia CAC 1 (37th percentile) Elevated cholesterol levels with low coronary calcium  score. Prefers to avoid statins due to side effects and personal preference. Weight gain noted. - Continue lifestyle modifications including diet and exercise. - Monitor cholesterol levels and weight. - Follow up in one year unless symptoms change.              Follow-up: Return in about 1 year (around 01/14/2025).   Signed, Darryle DASEN. Barbaraann, MD, [facility]  [facility]  [address] [place], [place] [address] [phone]  4:06 PM

## 2024-01-15 NOTE — Patient Instructions (Signed)
 Medication Instructions:  No Changes  Follow-Up: At [facility], you and your health needs are our priority.  As part of our continuing mission to provide you with exceptional heart care, our providers are all part of one team.  This team includes your primary Cardiologist (physician) and Advanced Practice Providers or APPs (Physician Assistants and Nurse Practitioners) who all work together to provide you with the care you need, when you need it.  Your next appointment:   1 year(s)  Provider:   One of our Advanced Practice Providers (APPs): Morse Clause, PA-C  Lamarr Satterfield, NP Miriam Shams, NP  Olivia Pavy, PA-C Josefa Beauvais, NP  Leontine Salen, PA-C Orren Fabry, PA-C  [place], PA-C Ernest Dick, NP  Damien Braver, NP Jon Hails, PA-C  Waddell Donath, PA-C    Dayna Dunn, PA-C  Scott Weaver, PA-C Lum Louis, NP Katlyn West, NP Callie Goodrich, PA-C  Xika Zhao, NP Sheng Haley, PA-C    Kathleen Johnson, PA-C

## 2024-01-21 ENCOUNTER — Ambulatory Visit: Admitting: Cardiovascular Disease

## 2024-01-31 ENCOUNTER — Ambulatory Visit: Admitting: Cardiology
